# Patient Record
Sex: Male | Born: 1990 | Hispanic: Yes | Marital: Married | Smoking: Never smoker
Health system: Southern US, Community
[De-identification: ages and names within clinical notes are randomized; demographics above are authoritative.]

## PROBLEM LIST (undated history)

## (undated) DIAGNOSIS — U071 COVID-19: Secondary | ICD-10-CM

---
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
## 2017-07-22 DIAGNOSIS — G43909 Migraine, unspecified, not intractable, without status migrainosus: Secondary | ICD-10-CM | POA: Insufficient documentation

## 2019-06-02 ENCOUNTER — Emergency Department (HOSPITAL_COMMUNITY)
Admission: EM | Admit: 2019-06-02 | Discharge: 2019-06-02 | Disposition: A | Discharge status: Home / Self Care | Payer: HRSA Program | Attending: Emergency Medicine | Admitting: Emergency Medicine

## 2019-06-02 ENCOUNTER — Encounter (HOSPITAL_COMMUNITY): Payer: Self-pay | Admitting: Emergency Medicine

## 2019-06-02 ENCOUNTER — Emergency Department (HOSPITAL_COMMUNITY): Payer: HRSA Program

## 2019-06-02 ENCOUNTER — Other Ambulatory Visit: Payer: Self-pay

## 2019-06-02 DIAGNOSIS — R05 Cough: Secondary | ICD-10-CM

## 2019-06-02 DIAGNOSIS — U071 COVID-19: Secondary | ICD-10-CM | POA: Diagnosis not present

## 2019-06-02 DIAGNOSIS — R0602 Shortness of breath: Secondary | ICD-10-CM | POA: Diagnosis present

## 2019-06-02 DIAGNOSIS — R059 Cough, unspecified: Secondary | ICD-10-CM

## 2019-06-02 HISTORY — DX: COVID-19: U07.1

## 2019-06-02 LAB — CBC WITH DIFFERENTIAL/PLATELET
Abs Immature Granulocytes: 0.02 10*3/uL (ref 0.00–0.07)
Basophils Absolute: 0.1 10*3/uL (ref 0.0–0.1)
Basophils Relative: 1 %
Eosinophils Absolute: 0.2 10*3/uL (ref 0.0–0.5)
Eosinophils Relative: 2 %
HCT: 45.1 % (ref 39.0–52.0)
Hemoglobin: 15 g/dL (ref 13.0–17.0)
Immature Granulocytes: 0 %
Lymphocytes Relative: 40 %
Lymphs Abs: 3.3 10*3/uL (ref 0.7–4.0)
MCH: 29.6 pg (ref 26.0–34.0)
MCHC: 33.3 g/dL (ref 30.0–36.0)
MCV: 89.1 fL (ref 80.0–100.0)
Monocytes Absolute: 0.6 10*3/uL (ref 0.1–1.0)
Monocytes Relative: 7 %
Neutro Abs: 4.2 10*3/uL (ref 1.7–7.7)
Neutrophils Relative %: 50 %
Platelets: 497 10*3/uL — ABNORMAL HIGH (ref 150–400)
RBC: 5.06 MIL/uL (ref 4.22–5.81)
RDW: 12.6 % (ref 11.5–15.5)
WBC: 8.3 10*3/uL (ref 4.0–10.5)
nRBC: 0 % (ref 0.0–0.2)

## 2019-06-02 LAB — I-STAT CHEM 8, ED
BUN: 14 mg/dL (ref 6–20)
Calcium, Ion: 1.17 mmol/L (ref 1.15–1.40)
Chloride: 105 mmol/L (ref 98–111)
Creatinine, Ser: 1 mg/dL (ref 0.61–1.24)
Glucose, Bld: 122 mg/dL — ABNORMAL HIGH (ref 70–99)
HCT: 44 % (ref 39.0–52.0)
Hemoglobin: 15 g/dL (ref 13.0–17.0)
Potassium: 3.8 mmol/L (ref 3.5–5.1)
Sodium: 142 mmol/L (ref 135–145)
TCO2: 26 mmol/L (ref 22–32)

## 2019-06-02 LAB — TROPONIN I (HIGH SENSITIVITY): Troponin I (High Sensitivity): 7 ng/L (ref <18)

## 2019-06-02 MED ORDER — IOHEXOL 350 MG/ML SOLN
100.0000 mL | Freq: Once | INTRAVENOUS | Status: AC | PRN
  Administered 2019-06-02: 20:00:00 100 mL via INTRAVENOUS

## 2019-06-02 MED ORDER — BENZONATATE 100 MG PO CAPS: 100.0000 mg | ORAL_CAPSULE | Freq: Three times a day (TID) | ORAL | 0 refills | Status: DC

## 2019-06-02 MED ORDER — ALBUTEROL SULFATE HFA 108 (90 BASE) MCG/ACT IN AERS: 1.0000 | INHALATION_SPRAY | Freq: Four times a day (QID) | RESPIRATORY_TRACT | 0 refills | Status: DC | PRN

## 2019-06-02 MED ORDER — SODIUM CHLORIDE 0.9 % IV BOLUS
1000.0000 mL | Freq: Once | INTRAVENOUS | Status: AC
  Administered 2019-06-02: 1000 mL via INTRAVENOUS

## 2019-06-02 MED ORDER — PREDNISONE 50 MG PO TABS: 50.0000 mg | ORAL_TABLET | Freq: Every day | ORAL | 0 refills | Status: AC

## 2019-06-02 NOTE — ED Triage Notes (Signed)
Sent from urgent care- with cough-- dx with covid 3 weeks ago, had a neg test on 25th, but has had nonstop cough since- no fever--

## 2019-06-02 NOTE — Discharge Instructions (Addendum)
You will need to follow-up outpatient in the post Covid clinic.  I will give him a short course of steroids, albuterol and Tessalon Perles to help with your symptoms.  Return to emergency department for any new worsening symptoms  Your chest of your CT showed they have had Covid pneumonia however denies any active pneumonia at this time.  You have no evidence of blood clot or enlarged heart or fluid on your lungs.  Your labs were unremarkable here in the emergency department

## 2019-06-02 NOTE — ED Provider Notes (Signed)
[facility] EMERGENCY DEPARTMENT Provider Note   CSN: [id number] Arrival date & time: 06/02/19  1752    History Chief Complaint  Patient presents with  . Cough  . covid sx    Julian Carter is a 29 y.o. male.  Prior medical history presents for evaluation of cough and shortness of breath.  Diagnosed with Covid 3 weeks ago.  Had negative test on 3/25.  Has continued to have nonproductive cough.  Gets short of breath with minimal activities.  No chest pain.  No unilateral leg swelling, redness or warmth.  No prior history of PE or DVTs.  No fever, chills, nausea, vomiting, abdominal pain, diarrhea, dysuria.  He has not take anything for symptoms.  No additional aggravating or alleviating factors.  Sent from [facility] urgent care for CTA chest to r/o PE.  History obtained from patient and past medical record.  No interpreter is used.  HPI     Past Medical History:  Diagnosis Date  . COVID-19     There are no problems to display for this patient.   History reviewed. No pertinent surgical history.     No family history on file.  Social History   Tobacco Use  . Smoking status: Never Smoker  . Smokeless tobacco: Never Used  Substance Use Topics  . Alcohol use: Not Currently  . Drug use: Not Currently    Home Medications Prior to Admission medications   Medication Sig Start Date End Date Taking? Authorizing Provider  acetaminophen (TYLENOL) 500 MG tablet Take 1,000 mg by mouth every 6 (six) hours as needed for headache.   Yes [provider]  guaiFENesin-dextromethorphan (ROBITUSSIN DM) 100-10 MG/5ML syrup Take 15 mLs by mouth at bedtime as needed for cough.   Yes [provider]  albuterol (VENTOLIN HFA) 108 (90 Base) MCG/ACT inhaler Inhale 1-2 puffs into the lungs every 6 (six) hours as needed for wheezing or shortness of breath. 06/02/19   Kaven Cumbie A, PA-C  benzonatate (TESSALON) 100 MG capsule Take 1 capsule (100 mg total) by  mouth every 8 (eight) hours. 06/02/19   Shelsy Seng A, PA-C  predniSONE (DELTASONE) 50 MG tablet Take 1 tablet (50 mg total) by mouth daily for 5 days. 06/02/19 06/07/19  Felita Bump A, PA-C    Allergies    Patient has no known allergies.  Review of Systems   Review of Systems  Constitutional: Positive for activity change, appetite change and fatigue.  HENT: Negative.   Respiratory: Positive for cough and shortness of breath. Negative for apnea, choking, chest tightness, wheezing and stridor.   Cardiovascular: Negative.   Gastrointestinal: Negative.   Genitourinary: Negative.   Musculoskeletal: Negative.   Skin: Negative.   Neurological: Negative.   All other systems reviewed and are negative.   Physical Exam Updated Vital Signs BP 119/81 (BP Location: Left Arm)   Pulse 82   Temp 98.7 F (37.1 C) (Oral)   Resp 17   Ht 4\' 11"  (1.499 m)   Wt 59 kg   SpO2 100%   BMI 26.26 kg/m   Physical Exam Vitals and nursing note reviewed.  Constitutional:      General: He is not in acute distress.    Appearance: He is not ill-appearing, toxic-appearing or diaphoretic.  HENT:     Head: Normocephalic and atraumatic.     Jaw: There is normal jaw occlusion.     Right Ear: Tympanic membrane, ear canal and external ear normal. There is no  impacted cerumen. No hemotympanum. Tympanic membrane is not injected, scarred, perforated, erythematous, retracted or bulging.     Left Ear: Tympanic membrane, ear canal and external ear normal. There is no impacted cerumen. No hemotympanum. Tympanic membrane is not injected, scarred, perforated, erythematous, retracted or bulging.     Ears:     Comments: No Mastoid tenderness.    Nose:     Comments: No rhinorrhea and congestion to bilateral nares.  No sinus tenderness.    Mouth/Throat:     Comments: Posterior oropharynx clear.  Mucous membranes moist.  Tonsils without erythema or exudate.  Uvula midline without deviation.  No evidence of PTA or RPA.   No drooling, dysphasia or trismus.  Phonation normal. Neck:     Trachea: Trachea and phonation normal.     Meningeal: Brudzinski's sign and Kernig's sign absent.     Comments: No Neck stiffness or neck rigidity.  No meningismus.  No cervical lymphadenopathy. Cardiovascular:     Rate and Rhythm: Tachycardia present.     Pulses: Normal pulses.          Radial pulses are 2+ on the right side and 2+ on the left side.     Heart sounds: Normal heart sounds.     Comments: No murmurs rubs or gallops. HT 105 in room. Pulmonary:     Effort: Pulmonary effort is normal.     Breath sounds: Normal breath sounds and air entry.     Comments: Clear to auscultation bilaterally without wheeze, rhonchi or rales.  No accessory muscle usage.  Able speak in full sentences. Abdominal:     Comments: Soft, nontender without rebound or guarding.  No CVA tenderness.  Musculoskeletal:     Comments: Moves all 4 extremities without difficulty.  Lower extremities without edema, erythema or warmth.  Skin:    Comments: Brisk capillary refill.  No rashes or lesions.  Neurological:     Mental Status: He is alert.     Comments: Ambulatory in department without difficulty.  Cranial nerves II through XII grossly intact.  No facial droop.  No aphasia.     ED Results / Procedures / Treatments   Labs (all labs ordered are listed, but only abnormal results are displayed) Labs Reviewed  CBC WITH DIFFERENTIAL/PLATELET - Abnormal; Notable for the following components:      Result Value   Platelets 497 (*)    All other components within normal limits  I-STAT CHEM 8, ED - Abnormal; Notable for the following components:   Glucose, Bld 122 (*)    All other components within normal limits  TROPONIN I (HIGH SENSITIVITY)    EKG EKG Interpretation  Date/Time:  Friday June 02 2019 19:11:12 EDT Ventricular Rate:  75 PR Interval:  130 QRS Duration: 70 QT Interval:  300 QTC Calculation: 335 R Axis:   -88 Text  Interpretation: Normal sinus rhythm Left axis deviation Cannot rule out Anterior infarct , age undetermined ST & T wave abnormality, consider lateral ischemia Abnormal ECG When compared to prior, less artifact. No STEMI Confirmed by Theda Belfast ([id number]) on 06/02/2019 7:41:21 PM   Radiology CT Angio Chest PE W and/or Wo Contrast  Result Date: 06/02/2019 CLINICAL DATA:  Shortness of breath, history of COVID-19 positivity EXAM: CT ANGIOGRAPHY CHEST WITH CONTRAST TECHNIQUE: Multidetector CT imaging of the chest was performed using the standard protocol during bolus administration of intravenous contrast. Multiplanar CT image reconstructions and MIPs were obtained to evaluate the vascular anatomy. CONTRAST:  OMNIPAQUE IOHEXOL  350 MG/ML SOLN COMPARISON:  None. FINDINGS: Cardiovascular: Thoracic aorta shows no evidence of aneurysmal dilatation or dissection. No cardiac enlargement is seen. The pulmonary artery shows a normal branching pattern. No filling defect is identified to suggest pulmonary embolism. Mediastinum/Nodes: Thoracic inlet is within normal limits. No hilar or mediastinal adenopathy is noted. The esophagus as visualized is within normal limits. Lungs/Pleura: The lungs are well aerated bilaterally. Some minimal ground-glass opacities are noted likely related to residual from the prior history of COVID-19 positivity. No focal infiltrate or sizable effusion is seen. No parenchymal nodules are noted. Upper Abdomen: Visualized upper abdomen shows no acute abnormality. Fatty infiltration of the liver is noted. Musculoskeletal: No chest wall abnormality. No acute or significant osseous findings. Review of the MIP images confirms the above findings. IMPRESSION: No evidence of pulmonary emboli. Minimal residual ground-glass opacities from prior COVID-19 positivity. No acute abnormality is noted. Electronically Signed   By: Alcide Clever M.D.   On: 06/02/2019 20:05    Procedures Procedures (including  critical care time)  Medications Ordered in ED Medications  sodium chloride 0.9 % bolus 1,000 mL (0 mLs Intravenous Stopped 06/02/19 1955)  iohexol (OMNIPAQUE) 350 MG/ML injection 100 mL (100 mLs Intravenous Contrast Given 06/02/19 1953)   ED Course  I have reviewed the triage vital signs and the nursing notes.  Pertinent labs & imaging results that were available during my care of the patient were reviewed by me and considered in my medical decision making (see chart for details).  29 year old male appears otherwise well presents for evaluation of cough and shortness of breath.  He is afebrile, nonseptic, non-ill-appearing.  Remote history of Covid diagnosis 3 weeks ago.  Has having some dyspnea on exertion heart is not chest pain.  No evidence of DVT on exam.  Was seen by urgent care today noted to be tachycardic.  Had chest x-ray which I personally reviewed which did not show any evidence of cardiomegaly, infiltrates, pneumothorax or pulmonary edema.  He was sent to the emergency department for CT scan of chest to rule out PE.  Labs and imaging personally reviewed and interpreted: CBC without leukocytosis Chem-8 with mild hyperglycemia were noted electrolyte, renal abnormality CTA negative for PE, does have groundglass opacities consistent with prior Covid infection EKG without STEMI  Troponin 7, low suspicion for ACS given symptoms times weeks.  Patient reassessed.  Question post Covid syndrome. Tachycardia improved with IVF. Tolerating PO intake without difficulty.  Will DC home with short course of steroids given continued cough as well as some albuterol.  He will be given resources to follow-up outpatient in the post Covid clinic.  Ambulatory without any hypoxia.  Patient is to be discharged with recommendation to follow up with PCP in regards to today's hospital visit. VSS, no tracheal deviation, no JVD or new murmur, RRR, breath sounds equal bilaterally, EKG without acute abnormalities,  negative troponin, and negative CTA chest. Pt has been advised to return to the ED if CP becomes exertional, associated with diaphoresis or nausea, radiates to left jaw/arm, worsens or becomes concerning in any way. Pt appears reliable for follow up and is agreeable to discharge.   The patient has been appropriately medically screened and/or stabilized in the ED. I have low suspicion for any other emergent medical condition which would require further screening, evaluation or treatment in the ED or require inpatient management.  Patient is hemodynamically stable and in no acute distress.  Patient able to ambulate in department prior to ED.  Evaluation does not show acute pathology that would require ongoing or additional emergent interventions while in the emergency department or further inpatient treatment.  I have discussed the diagnosis with the patient and answered all questions.  Pain is been managed while in the emergency department and patient has no further complaints prior to discharge.  Patient is comfortable with plan discussed in room and is stable for discharge at this time.  I have discussed strict return precautions for returning to the emergency department.  Patient was encouraged to follow-up with PCP/specialist refer to at discharge.    MDM Rules/Calculators/A&P                      Julian Carter was evaluated in Emergency Department on 06/02/2019 for the symptoms described in the history of present illness. He was evaluated in the context of the global COVID-19 pandemic, which necessitated consideration that the patient might be at risk for infection with the SARS-CoV-2 virus that causes COVID-19. Institutional protocols and algorithms that pertain to the evaluation of patients at risk for COVID-19 are in a state of rapid change based on information released by regulatory bodies including the CDC and federal and state organizations. These policies and algorithms were followed during the  patient's care in the ED. Final Clinical Impression(s) / ED Diagnoses Final diagnoses:  COVID-19  Cough  SOB (shortness of breath)    Rx / DC Orders ED Discharge Orders         Ordered    predniSONE (DELTASONE) 50 MG tablet  Daily     06/02/19 2037    albuterol (VENTOLIN HFA) 108 (90 Base) MCG/ACT inhaler  Every 6 hours PRN     06/02/19 2037    benzonatate (TESSALON) 100 MG capsule  Every 8 hours     06/02/19 2037           Cashlyn Huguley A, PA-C 06/02/19 2111    Tegeler, Canary Brim, MD 06/02/19 2312

## 2019-06-14 ENCOUNTER — Emergency Department (HOSPITAL_COMMUNITY): Payer: Self-pay

## 2019-06-14 ENCOUNTER — Emergency Department (HOSPITAL_COMMUNITY)
Admission: EM | Admit: 2019-06-14 | Discharge: 2019-06-15 | Disposition: A | Discharge status: Home / Self Care | Payer: Self-pay | Attending: Emergency Medicine | Admitting: Emergency Medicine

## 2019-06-14 ENCOUNTER — Other Ambulatory Visit: Payer: Self-pay

## 2019-06-14 ENCOUNTER — Encounter (HOSPITAL_COMMUNITY): Payer: Self-pay | Admitting: Emergency Medicine

## 2019-06-14 DIAGNOSIS — J069 Acute upper respiratory infection, unspecified: Secondary | ICD-10-CM | POA: Insufficient documentation

## 2019-06-14 DIAGNOSIS — J029 Acute pharyngitis, unspecified: Secondary | ICD-10-CM | POA: Insufficient documentation

## 2019-06-14 DIAGNOSIS — R0602 Shortness of breath: Secondary | ICD-10-CM | POA: Insufficient documentation

## 2019-06-14 LAB — URINALYSIS, ROUTINE W REFLEX MICROSCOPIC
Bilirubin Urine: NEGATIVE
Glucose, UA: NEGATIVE mg/dL
Hgb urine dipstick: NEGATIVE
Ketones, ur: 5 mg/dL — AB
Leukocytes,Ua: NEGATIVE
Nitrite: NEGATIVE
Protein, ur: NEGATIVE mg/dL
Specific Gravity, Urine: 1.012 (ref 1.005–1.030)
pH: 5 (ref 5.0–8.0)

## 2019-06-14 LAB — CBC WITH DIFFERENTIAL/PLATELET
Abs Immature Granulocytes: 0.08 10*3/uL — ABNORMAL HIGH (ref 0.00–0.07)
Basophils Absolute: 0 10*3/uL (ref 0.0–0.1)
Basophils Relative: 0 %
Eosinophils Absolute: 0 10*3/uL (ref 0.0–0.5)
Eosinophils Relative: 0 %
HCT: 42.5 % (ref 39.0–52.0)
Hemoglobin: 14.8 g/dL (ref 13.0–17.0)
Immature Granulocytes: 1 %
Lymphocytes Relative: 10 %
Lymphs Abs: 1.5 10*3/uL (ref 0.7–4.0)
MCH: 30.5 pg (ref 26.0–34.0)
MCHC: 34.8 g/dL (ref 30.0–36.0)
MCV: 87.4 fL (ref 80.0–100.0)
Monocytes Absolute: 0.6 10*3/uL (ref 0.1–1.0)
Monocytes Relative: 4 %
Neutro Abs: 12 10*3/uL — ABNORMAL HIGH (ref 1.7–7.7)
Neutrophils Relative %: 85 %
Platelets: 246 10*3/uL (ref 150–400)
RBC: 4.86 MIL/uL (ref 4.22–5.81)
RDW: 12.9 % (ref 11.5–15.5)
WBC: 14.2 10*3/uL — ABNORMAL HIGH (ref 4.0–10.5)
nRBC: 0 % (ref 0.0–0.2)

## 2019-06-14 LAB — COMPREHENSIVE METABOLIC PANEL
ALT: 85 U/L — ABNORMAL HIGH (ref 0–44)
AST: 43 U/L — ABNORMAL HIGH (ref 15–41)
Albumin: 3.9 g/dL (ref 3.5–5.0)
Alkaline Phosphatase: 94 U/L (ref 38–126)
Anion gap: 11 (ref 5–15)
BUN: 8 mg/dL (ref 6–20)
CO2: 22 mmol/L (ref 22–32)
Calcium: 8.7 mg/dL — ABNORMAL LOW (ref 8.9–10.3)
Chloride: 98 mmol/L (ref 98–111)
Creatinine, Ser: 0.83 mg/dL (ref 0.61–1.24)
GFR calc Af Amer: >60 mL/min (ref >60)
GFR calc non Af Amer: >60 mL/min (ref >60)
Glucose, Bld: 110 mg/dL — ABNORMAL HIGH (ref 70–99)
Potassium: 3.3 mmol/L — ABNORMAL LOW (ref 3.5–5.1)
Sodium: 131 mmol/L — ABNORMAL LOW (ref 135–145)
Total Bilirubin: 1 mg/dL (ref 0.3–1.2)
Total Protein: 7.2 g/dL (ref 6.5–8.1)

## 2019-06-14 LAB — LACTIC ACID, PLASMA: Lactic Acid, Venous: 1.1 mmol/L (ref 0.5–1.9)

## 2019-06-14 LAB — GROUP A STREP BY PCR: Group A Strep by PCR: NOT DETECTED

## 2019-06-14 MED ORDER — SODIUM CHLORIDE 0.9% FLUSH: 3.0000 mL | Freq: Once | INTRAVENOUS | Status: DC

## 2019-06-14 MED ORDER — ACETAMINOPHEN 325 MG PO TABS
650.0000 mg | ORAL_TABLET | Freq: Once | ORAL | Status: AC | PRN
  Administered 2019-06-14: 16:00:00 650 mg via ORAL
  Filled 2019-06-14: qty 2

## 2019-06-14 MED ORDER — IBUPROFEN 400 MG PO TABS
600.0000 mg | ORAL_TABLET | Freq: Once | ORAL | Status: AC
  Administered 2019-06-14: 600 mg via ORAL
  Filled 2019-06-14: qty 1

## 2019-06-14 MED ORDER — SODIUM CHLORIDE 0.9 % IV BOLUS
1000.0000 mL | Freq: Once | INTRAVENOUS | Status: AC
  Administered 2019-06-14: 1000 mL via INTRAVENOUS

## 2019-06-14 MED ORDER — SODIUM CHLORIDE 0.9 % IV BOLUS
1000.0000 mL | Freq: Once | INTRAVENOUS | Status: AC
  Administered 2019-06-14: 21:00:00 1000 mL via INTRAVENOUS

## 2019-06-14 MED ORDER — GUAIFENESIN-DM 100-10 MG/5ML PO SYRP: 5.0000 mL | ORAL_SOLUTION | Freq: Three times a day (TID) | ORAL | 0 refills | Status: DC | PRN

## 2019-06-14 NOTE — ED Triage Notes (Signed)
Pt c/o shortness of breath, cough and fever. States he tested +covid x 5 weeks ago and has had a negative test since. Febrile in triage.

## 2019-06-14 NOTE — Discharge Instructions (Signed)
Try keep yourself hydrated.  Take Motrin Tylenol as needed for fevers.  Return for worsening shortness of breath or worsening fatigue.

## 2019-06-14 NOTE — ED Provider Notes (Signed)
[facility] EMERGENCY DEPARTMENT Provider Note   CSN: [id number] Arrival date & time: 06/14/19  1543     History Chief Complaint  Patient presents with  . Cough    Julian Carter is a 29 y.o. male.  HPI Patient Modena Jansky with fever cough shortness of breath.  Around 5 weeks ago was positive for COVID-19.  Since the positive test he has also had a negative test.  He was seen in the ER 12 days ago.  Sent in from urgent care with shortness of breath.  Had CT scan which showed lung disease but no pulmonary embolism.  Had been doing well this had some chronic shortness of breath, however over the last few days now developed fever congestion and mild sore throat.  States he gets frequent strep infections.  States he has 2 daughters and one of them recently was warm and another had sore throat.  No real change in his cough which has had difficulty controlling.  No nausea or vomiting now but states he had some while he had Covid.  States he was hit fairly hard by the Covid infection.  Does not smoke.  No dysuria.  No diarrhea.  No abdominal pain.    Past Medical History:  Diagnosis Date  . COVID-19     There are no problems to display for this patient.   History reviewed. No pertinent surgical history.     No family history on file.  Social History   Tobacco Use  . Smoking status: Never Smoker  . Smokeless tobacco: Never Used  Substance Use Topics  . Alcohol use: Not Currently  . Drug use: Not Currently    Home Medications Prior to Admission medications   Medication Sig Start Date End Date Taking? Authorizing Provider  acetaminophen (TYLENOL) 500 MG tablet Take 1,000 mg by mouth every 6 (six) hours as needed for headache.   Yes [provider]  albuterol (VENTOLIN HFA) 108 (90 Base) MCG/ACT inhaler Inhale 1-2 puffs into the lungs every 6 (six) hours as needed for wheezing or shortness of breath. 06/02/19  Yes Henderly, Britni A, PA-C  benzonatate  (TESSALON) 100 MG capsule Take 1 capsule (100 mg total) by mouth every 8 (eight) hours. 06/02/19  Yes Henderly, Britni A, PA-C  guaiFENesin-dextromethorphan (ROBITUSSIN DM) 100-10 MG/5ML syrup Take 5 mLs by mouth 3 (three) times daily as needed for cough. 06/14/19   Benjiman Core, MD    Allergies    Patient has no known allergies.  Review of Systems   Review of Systems  Constitutional: Positive for appetite change.  HENT: Positive for congestion and sore throat.   Respiratory: Positive for cough and shortness of breath.   Cardiovascular: Negative for chest pain.  Gastrointestinal: Negative for abdominal pain.  Genitourinary: Negative for flank pain.  Musculoskeletal: Negative for back pain.  Skin: Negative for rash.  Neurological: Negative for weakness.  Psychiatric/Behavioral: Negative for confusion.    Physical Exam Updated Vital Signs BP 129/83   Pulse (!) 125   Temp (!) 102.3 F (39.1 C) (Oral)   Resp (!) 26   Ht 4\' 11"  (1.499 m)   Wt 59 kg   SpO2 98%   BMI 26.26 kg/m   Physical Exam Vitals and nursing note reviewed.  HENT:     Mouth/Throat:     Pharynx: Posterior oropharyngeal erythema present. No oropharyngeal exudate.     Comments: Bilateral tonsils swollen.  No exudate.  Uvula midline. Cardiovascular:  Rate and Rhythm: Tachycardia present.  Pulmonary:     Breath sounds: No wheezing, rhonchi or rales.  Abdominal:     Tenderness: There is no abdominal tenderness.  Musculoskeletal:     Right lower leg: No edema.     Left lower leg: No edema.  Skin:    Capillary Refill: Capillary refill takes less than 2 seconds.  Neurological:     Mental Status: He is alert and oriented to person, place, and time.     ED Results / Procedures / Treatments   Labs (all labs ordered are listed, but only abnormal results are displayed) Labs Reviewed  COMPREHENSIVE METABOLIC PANEL - Abnormal; Notable for the following components:      Result Value   Sodium 131 (*)     Potassium 3.3 (*)    Glucose, Bld 110 (*)    Calcium 8.7 (*)    AST 43 (*)    ALT 85 (*)    All other components within normal limits  CBC WITH DIFFERENTIAL/PLATELET - Abnormal; Notable for the following components:   WBC 14.2 (*)    Neutro Abs 12.0 (*)    Abs Immature Granulocytes 0.08 (*)    All other components within normal limits  GROUP A STREP BY PCR  LACTIC ACID, PLASMA  LACTIC ACID, PLASMA  URINALYSIS, ROUTINE W REFLEX MICROSCOPIC    EKG None  Radiology DG Chest Portable 1 View  Result Date: 06/14/2019 CLINICAL DATA:  Shortness of breath and fever EXAM: PORTABLE CHEST 1 VIEW COMPARISON:  CT 06/02/2019 FINDINGS: The heart size and mediastinal contours are within normal limits. Both lungs are clear. The visualized skeletal structures are unremarkable. IMPRESSION: No active disease. Electronically Signed   By: Jasmine Pang M.D.   On: 06/14/2019 20:43    Procedures Procedures (including critical care time)  Medications Ordered in ED Medications  sodium chloride flush (NS) 0.9 % injection 3 mL (3 mLs Intravenous Not Given 06/14/19 2017)  acetaminophen (TYLENOL) tablet 650 mg (650 mg Oral Given 06/14/19 1608)  sodium chloride 0.9 % bolus 1,000 mL (0 mLs Intravenous Stopped 06/14/19 2231)  sodium chloride 0.9 % bolus 1,000 mL (0 mLs Intravenous Stopped 06/14/19 2326)  ibuprofen (ADVIL) tablet 600 mg (600 mg Oral Given 06/14/19 2231)    ED Course  I have reviewed the triage vital signs and the nursing notes.  Pertinent labs & imaging results that were available during my care of the patient were reviewed by me and considered in my medical decision making (see chart for details).    MDM Rules/Calculators/A&P                      Patient presents with shortness of breath and cough.  Had Covid around 5 weeks ago.  2 weeks ago had a CT scan of showed lung changes from the Covid but no pneumonia.  The last couple days has had fevers.  Still has cough but really unchanged.  Blood  pressure maintained.  Normal lactic acid.  X-ray reassuring.  However does have tachycardia.  Has remained tachycardic.  It is a sinus tachycardia.  Not hypoxic with ambulation.  Discussed with patient and will give a trial at home.  Patient's family members at home had URI symptoms.  Do not think he needs any Covid test since he was positive 5 weeks ago.  Doubt pulmonary embolism at this time.  Will discharge home.  LFTs are mildly elevated which will need to be followed. Final  Clinical Impression(s) / ED Diagnoses Final diagnoses:  Upper respiratory tract infection, unspecified type    Rx / DC Orders ED Discharge Orders         Ordered    guaiFENesin-dextromethorphan (ROBITUSSIN DM) 100-10 MG/5ML syrup  3 times daily PRN     06/14/19 2331           Davonna Belling, MD 06/14/19 2333

## 2019-06-15 NOTE — ED Notes (Signed)
Discharge instructions reviewed with pt. Pt verbalized understanding.   

## 2019-07-03 ENCOUNTER — Telehealth: Payer: Self-pay | Admitting: Pulmonary Disease

## 2019-07-03 NOTE — Telephone Encounter (Signed)
Spoke with pt further, he has no other side effects from covid aside from intermittent back muscle aches.  Advised pt that we can keep appt tomorrow as scheduled.  Nothing further needed at this time- will close encounter.

## 2019-07-03 NOTE — Telephone Encounter (Signed)
Patient is returning phone call. Patient phone number is [phone].

## 2019-07-03 NOTE — Telephone Encounter (Signed)
ATC patient to discuss in further detail - no answer and VM box is full.  WCB.

## 2019-07-04 ENCOUNTER — Encounter: Payer: Self-pay | Admitting: Pulmonary Disease

## 2019-07-04 ENCOUNTER — Other Ambulatory Visit: Payer: Self-pay

## 2019-07-04 ENCOUNTER — Ambulatory Visit (INDEPENDENT_AMBULATORY_CARE_PROVIDER_SITE_OTHER): Payer: Self-pay | Admitting: Pulmonary Disease

## 2019-07-04 DIAGNOSIS — R0602 Shortness of breath: Secondary | ICD-10-CM

## 2019-07-04 MED ORDER — BUDESONIDE-FORMOTEROL FUMARATE 160-4.5 MCG/ACT IN AERO: 2.0000 | INHALATION_SPRAY | Freq: Two times a day (BID) | RESPIRATORY_TRACT | 6 refills | Status: DC

## 2019-07-04 NOTE — Progress Notes (Signed)
Julian Carter    [id number]    1991-02-25  Primary Care Physician:Patient, No Pcp Per  Referring Physician: No referring provider defined for this encounter.  Chief complaint:   Ground glass opacities on CT after Covid-19 pneumonia   HPI: Julian Carter is a 29 year old male with out a significant past medical history before testing positive for COVID-19 on 3/15.  Patient self quarantine for approximately 2 weeks and was improving with overall symptoms continue to have shortness of breath and cough.  He went to urgent care on 4/2 sent [facility] to [facility].  In the emergency department he was noted to have tachycardia and ground glass opacities on CT after post Covid-19 Pneumonia .  Patient was discharged from the emergency department and presented again on 4/14 with cough, shortness of breath and tachycardia.  ED provider noted patient was in sinus tachycardia with normal BP, and had a reassuring x-ray.  Patient was febril at the time to 102.3.   Patients SOB and cough are improving. He continues to have a cough 2-3 times a day. In the last week patient has had 3-4 episodes per day of sharp right sided back pain and noticed he starting coughing up blood.    Pets: No pets Occupation: Works as a Barrister's clerk  Exposures: No know exposure, no hot tub or mold.  Smoking history: Not a current smoker. Smokes a cigarette or two a month with co-workers.  Travel history: No recent travel history  Relevant family history: no family history of lung disease    Outpatient Encounter Medications as of 07/04/2019  Medication Sig  . acetaminophen (TYLENOL) 500 MG tablet Take 1,000 mg by mouth every 6 (six) hours as needed for headache.  . albuterol (VENTOLIN HFA) 108 (90 Base) MCG/ACT inhaler Inhale 1-2 puffs into the lungs every 6 (six) hours as needed for wheezing or shortness of breath.  . benzonatate (TESSALON) 100 MG capsule Take 1 capsule (100 mg total) by mouth every 8 (eight) hours.    Marland Kitchen guaiFENesin-dextromethorphan (ROBITUSSIN DM) 100-10 MG/5ML syrup Take 5 mLs by mouth 3 (three) times daily as needed for cough.   No facility-administered encounter medications on file as of 07/04/2019.    Allergies as of 07/04/2019  . (No Known Allergies)    Past Medical History:  Diagnosis Date  . COVID-19     History reviewed. No pertinent surgical history.  History reviewed. No pertinent family history.  Social History   Socioeconomic History  . Marital status: Married    Spouse name: Not on file  . Number of children: Not on file  . Years of education: Not on file  . Highest education level: Not on file  Occupational History  . Not on file  Tobacco Use  . Smoking status: Never Smoker  . Smokeless tobacco: Never Used  Substance and Sexual Activity  . Alcohol use: Not Currently    Comment: occ.   . Drug use: Not Currently  . Sexual activity: Not on file  Other Topics Concern  . Not on file  Social History Narrative  . Not on file   Social Determinants of Health   Financial Resource Strain:   . Difficulty of Paying Living Expenses:   Food Insecurity:   . Worried About Charity fundraiser in the Last Year:   . Arboriculturist in the Last Year:   Transportation Needs:   . Film/video editor (Medical):   Marland Kitchen  Lack of Transportation (Non-Medical):   Physical Activity:   . Days of Exercise per Week:   . Minutes of Exercise per Session:   Stress:   . Feeling of Stress :   Social Connections:   . Frequency of Communication with Friends and Family:   . Frequency of Social Gatherings with Friends and Family:   . Attends Religious Services:   . Active Member of Clubs or Organizations:   . Attends Banker Meetings:   Marland Kitchen Marital Status:   Intimate Partner Violence:   . Fear of Current or Ex-Partner:   . Emotionally Abused:   Marland Kitchen Physically Abused:   . Sexually Abused:     Review of systems: Review of Systems  Constitutional: Negative for  fever and chills.  HENT: Negative.   Eyes: Negative for blurred vision.  Respiratory: as per HPI  Cardiovascular: Negative for chest pain and palpitations.  Gastrointestinal: Negative for vomiting, diarrhea, blood per rectum. Genitourinary: Negative for dysuria, urgency, frequency and hematuria.  Musculoskeletal: Negative for myalgias, back pain and joint pain.  Skin: Negative for itching and rash.  Neurological: Negative for dizziness, tremors, focal weakness, seizures and loss of consciousness.  Endo/Heme/Allergies: Negative for environmental allergies.  Psychiatric/Behavioral: Negative for depression, suicidal ideas and hallucinations.  All other systems reviewed and are negative.  Physical Exam: Blood pressure 108/64, pulse 80, temperature 97.7 F (36.5 C), temperature source Temporal, height 4\' 11"  (1.499 m), weight 124 lb 12.8 oz (56.6 kg), SpO2 97 %. Gen:      No acute distress HEENT:  EOMI, sclera anicteric Neck:     No masses; no thyromegaly Lungs:    Clear to auscultation bilaterally; normal respiratory effor CV:         Regular rate and rhythm; no murmurs Abd:      + bowel sounds; soft, non-tender; no palpable masses, no distension Ext:    No edema; adequate peripheral perfusion Skin:      Warm and dry; no rash Neuro: alert and oriented x 3 Psych: normal mood and affect  Data Reviewed: Imaging: CT PE 06/02/2019: Ground glass opacities noted , no PE I have reviewed the images personally.  PFTs:  Labs:  CBC 4/14: WBC 14, neutrophil predominant    Assessment:  Patient presents approximately 7 weeks post Covid-19 infection. He was never hospitalized but presented multiple time to ED for SOB, cough, and tachycardia. CT PE on 4/02 did not show PE, but shows some ground glass opacities and patient symptoms suggest ongoing inflammatory process. Will prescribe Symbicort for inflammation.   - PFTs to better evaluate lung function  - High resolution CT to characterize  changes in lung parenchyma s/p Covid-19 pneumonia   Plan/Recommendations: - Symbicort - PFT's - High Resolution CT   6/02, MD PGY1   Attending note: I have seen and examined the patient. History, labs and imaging reviewed. Patient is 7 weeks post Covid.  Continues to have persistent symptoms, intermittent tachycardia with prior CT scan showing some groundglass opacities Checking high-res CT, PFTs for better evaluation Trial Symbicort.  Thurmon Fair MD [facility] 07/05/2019, 3:12 PM  CC: No ref. provider found

## 2019-07-04 NOTE — Patient Instructions (Signed)
Thank you for trusting [place] with your care. We will prescribe Symbicort to help with your lung inflammation. In addition we will better evaluate your lungs with pulmonary function test and CT Scan of your chest.   Symbicort PFT's High Resolution CT Scan Chest

## 2019-07-18 ENCOUNTER — Other Ambulatory Visit: Payer: Self-pay

## 2019-07-18 ENCOUNTER — Ambulatory Visit (INDEPENDENT_AMBULATORY_CARE_PROVIDER_SITE_OTHER)
Admission: RE | Admit: 2019-07-18 | Discharge: 2019-07-18 | Disposition: A | Discharge status: Home / Self Care | Payer: Self-pay | Source: Ambulatory Visit | Attending: Pulmonary Disease | Admitting: Pulmonary Disease

## 2019-07-18 DIAGNOSIS — R0602 Shortness of breath: Secondary | ICD-10-CM

## 2019-08-14 ENCOUNTER — Other Ambulatory Visit (HOSPITAL_COMMUNITY)
Admission: RE | Admit: 2019-08-14 | Discharge: 2019-08-14 | Disposition: A | Discharge status: Home / Self Care | Payer: HRSA Program | Source: Ambulatory Visit | Attending: Pulmonary Disease | Admitting: Pulmonary Disease

## 2019-08-14 DIAGNOSIS — Z20822 Contact with and (suspected) exposure to covid-19: Secondary | ICD-10-CM | POA: Insufficient documentation

## 2019-08-14 DIAGNOSIS — Z01812 Encounter for preprocedural laboratory examination: Secondary | ICD-10-CM | POA: Insufficient documentation

## 2019-08-14 LAB — SARS CORONAVIRUS 2 (TAT 6-24 HRS): SARS Coronavirus 2: NEGATIVE

## 2019-08-17 ENCOUNTER — Ambulatory Visit (INDEPENDENT_AMBULATORY_CARE_PROVIDER_SITE_OTHER): Payer: Self-pay | Admitting: Pulmonary Disease

## 2019-08-17 ENCOUNTER — Other Ambulatory Visit: Payer: Self-pay

## 2019-08-17 DIAGNOSIS — R0602 Shortness of breath: Secondary | ICD-10-CM

## 2019-08-17 LAB — PULMONARY FUNCTION TEST
DL/VA % pred: 93 %
DL/VA: 4.93 ml/min/mmHg/L
DLCO cor % pred: 96 %
DLCO cor: 22.74 ml/min/mmHg
DLCO unc % pred: 96 %
DLCO unc: 22.74 ml/min/mmHg
FEF 25-75 Post: 4.34 L/sec
FEF 25-75 Pre: 4.25 L/sec
FEF2575-%Change-Post: 2 %
FEF2575-%Pred-Post: 115 %
FEF2575-%Pred-Pre: 113 %
FEV1-%Change-Post: 0 %
FEV1-%Pred-Post: 98 %
FEV1-%Pred-Pre: 98 %
FEV1-Post: 3.31 L
FEV1-Pre: 3.31 L
FEV1FVC-%Change-Post: 0 %
FEV1FVC-%Pred-Pre: 106 %
FEV6-%Change-Post: 0 %
FEV6-%Pred-Post: 94 %
FEV6-%Pred-Pre: 95 %
FEV6-Post: 3.74 L
FEV6-Pre: 3.77 L
FEV6FVC-%Pred-Post: 100 %
FEV6FVC-%Pred-Pre: 100 %
FVC-%Change-Post: 0 %
FVC-%Pred-Post: 94 %
FVC-%Pred-Pre: 95 %
FVC-Post: 3.76 L
FVC-Pre: 3.77 L
Post FEV1/FVC ratio: 88 %
Post FEV6/FVC ratio: 100 %
Pre FEV1/FVC ratio: 88 %
Pre FEV6/FVC Ratio: 100 %
RV % pred: 135 %
RV: 1.37 L
TLC % pred: 105 %
TLC: 5.16 L

## 2019-08-17 NOTE — Progress Notes (Signed)
PFT done today. 

## 2019-08-18 NOTE — Telephone Encounter (Signed)
Dr. Mannam please advise on CT results. 

## 2019-08-18 NOTE — Telephone Encounter (Signed)
I think he is referring to PFTs done yesterday. I have already send a result note on CT that was seen on mychart by patient  Please let him know PFTs shows normal lung function.   Chilton Greathouse MD [facility] 08/18/2019, 4:15 PM

## 2021-04-24 IMAGING — CT CT ANGIO CHEST
2 of 6 series · 18 of 46 positions shown · IV contrast (omnipaque)
Comparison: None.

CLINICAL DATA: Shortness of breath, history of NGX69-OW positivity

EXAM:
CT ANGIOGRAPHY CHEST WITH CONTRAST
TECHNIQUE: Multidetector CT imaging of the chest was performed using the
standard protocol during bolus administration of intravenous
contrast. Multiplanar CT image reconstructions and MIPs were
obtained to evaluate the vascular anatomy.
CONTRAST:  100mL OMNIPAQUE IOHEXOL 350 MG/ML SOLN

[Series 6: thins · axial · 0.71mm/px · z∈[+959,+1189]mm · 15 of 252 slices shown]
[im 11/252  lung]
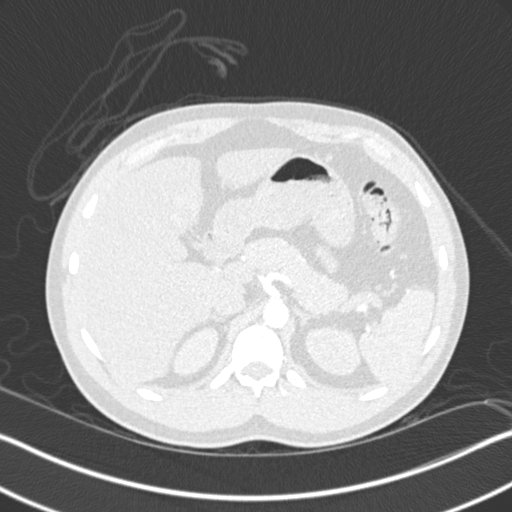
[im 33/252  soft-tissue]
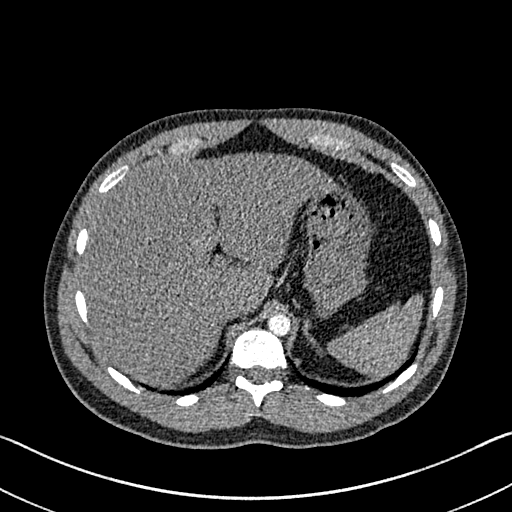
[im 44/252  lung]
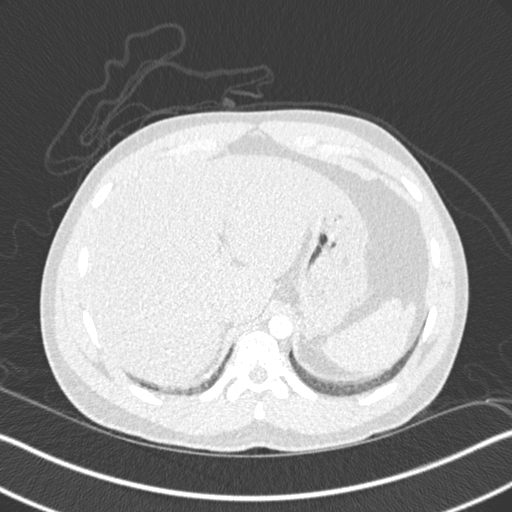
[im 66/252  soft-tissue]
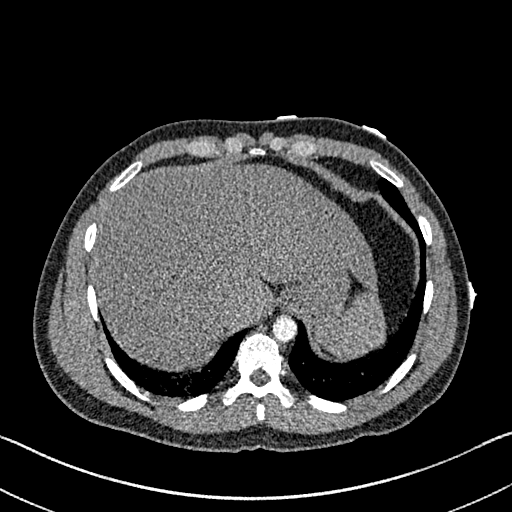
[im 77/252  lung]
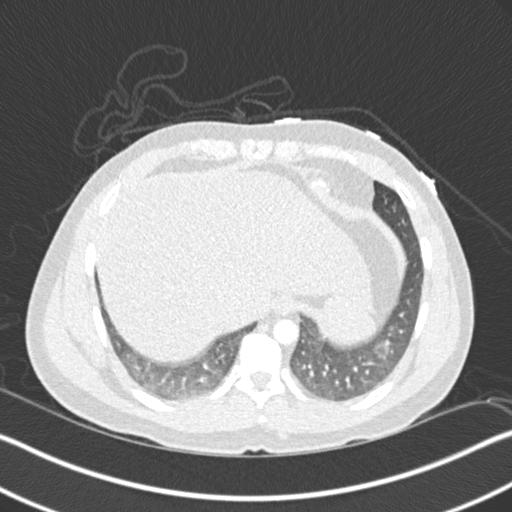
[im 99/252  soft-tissue]
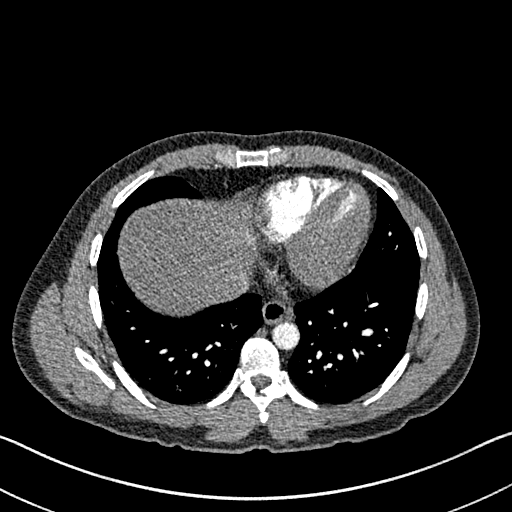
[im 110/252  lung]
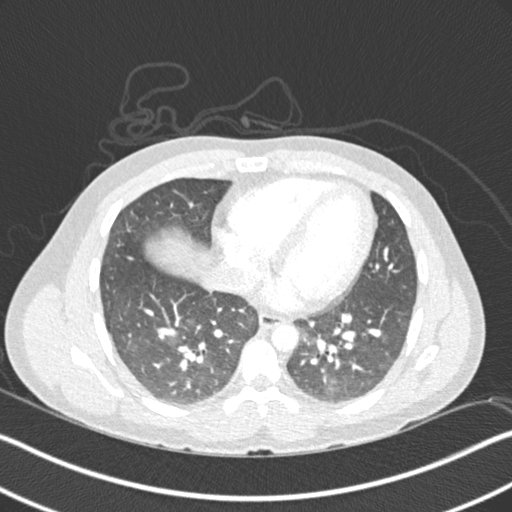
[im 131/252  soft-tissue]
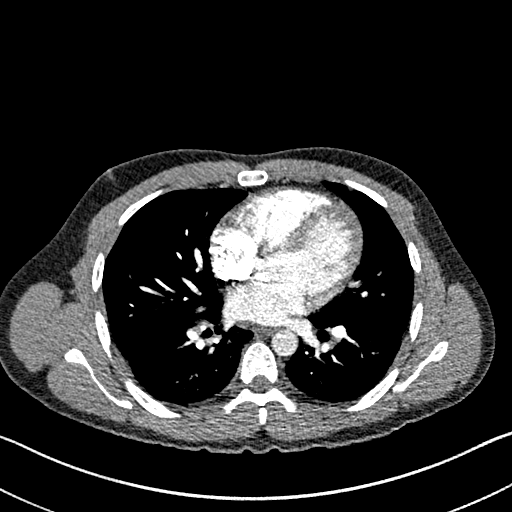
[im 142/252  lung]
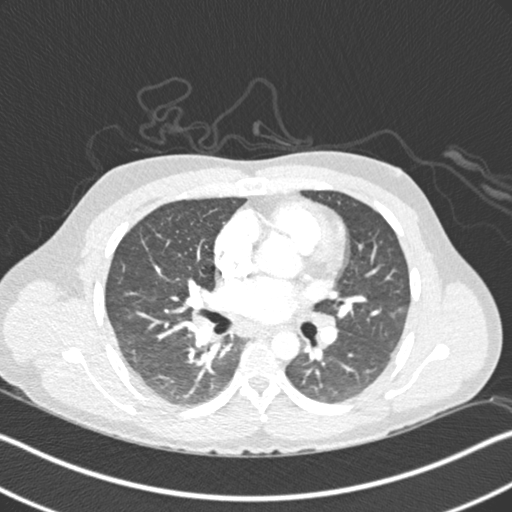
[im 153/252  soft-tissue]
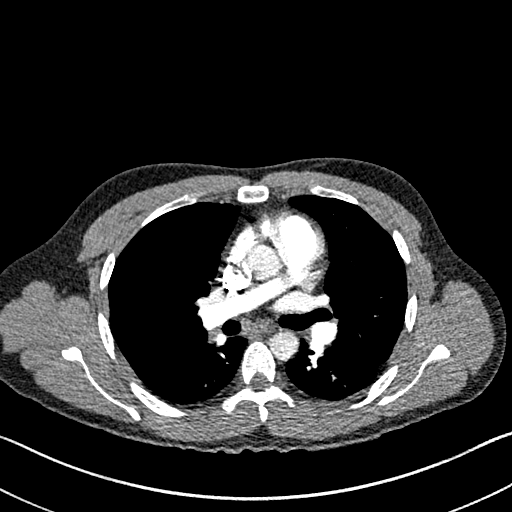
[im 175/252  lung]
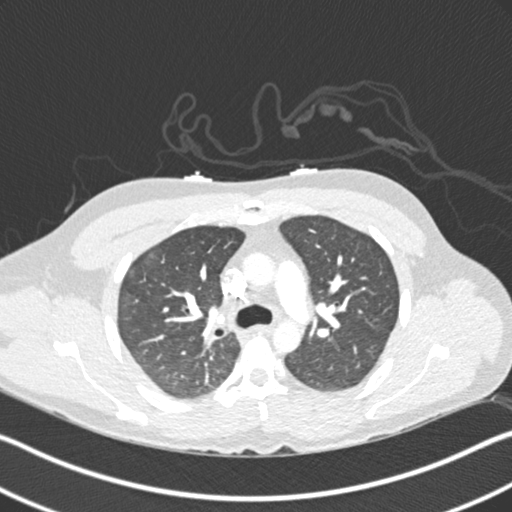
[im 186/252  soft-tissue]
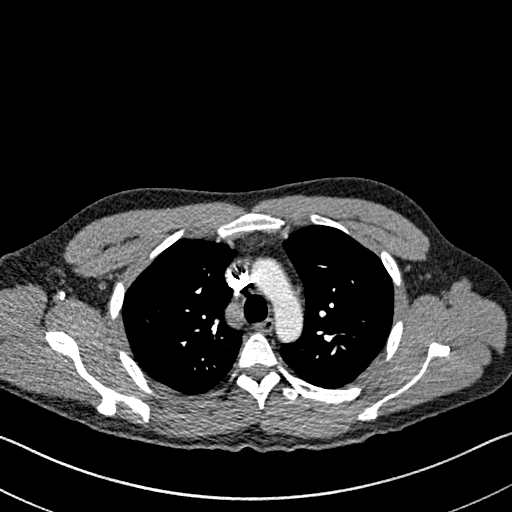
[im 208/252  lung]
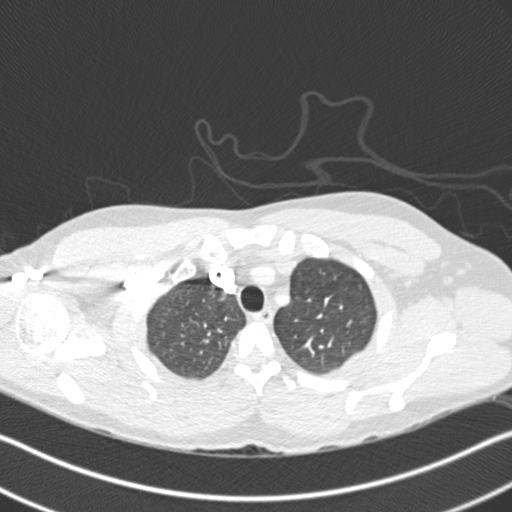
[im 219/252  soft-tissue]
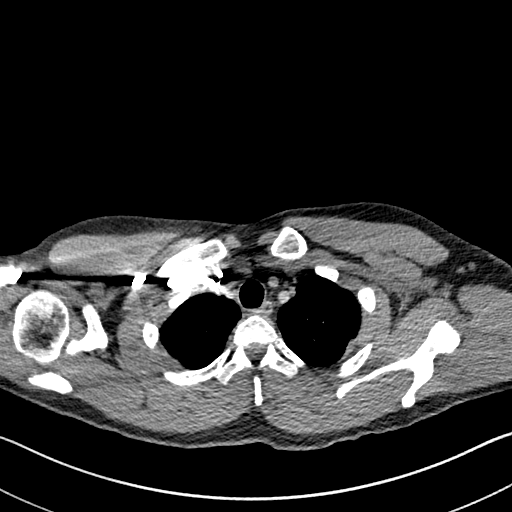
[im 241/252  lung]
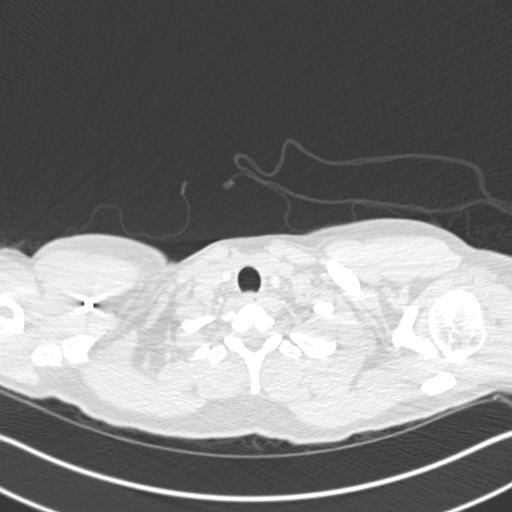

[Series 8: coronal mpr · coronal · 0.52mm/px · 3 of 129 slices shown]
[im 33/129  soft-tissue]
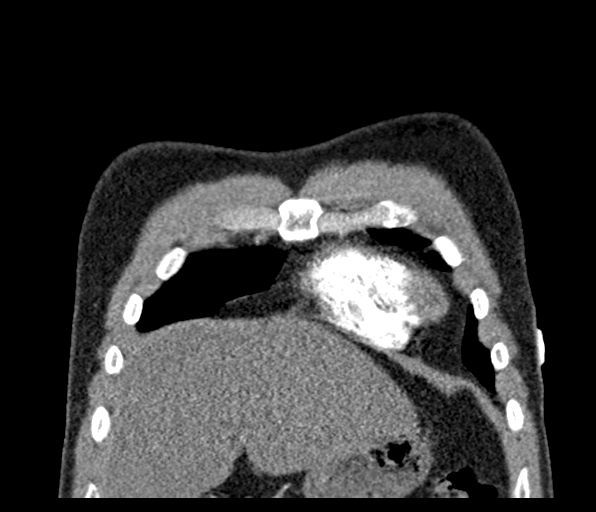
[im 65/129  soft-tissue]
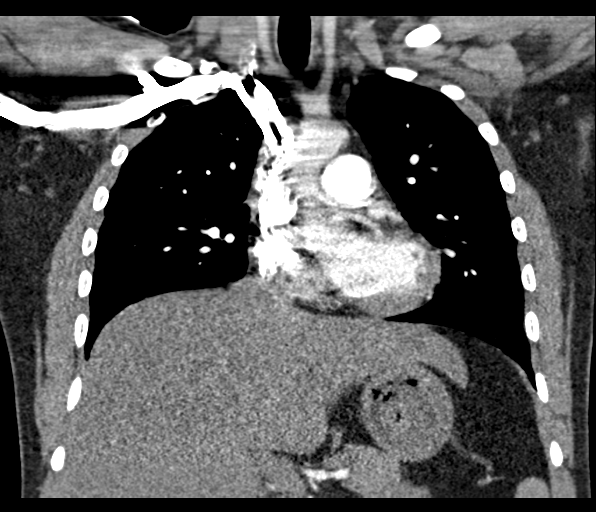
[im 97/129  soft-tissue]
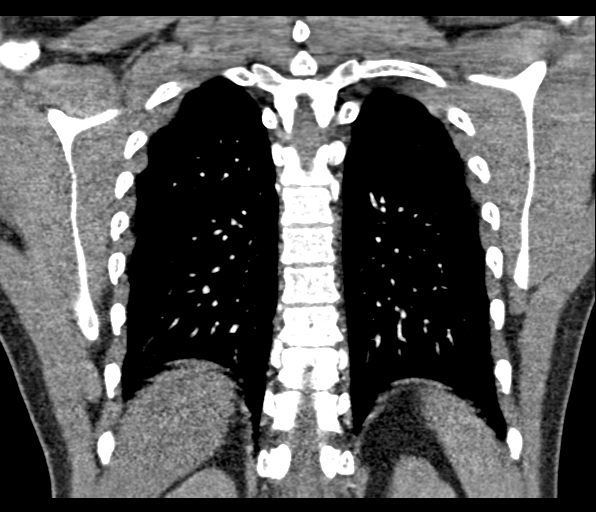

[18 of 46 positions shown; findings below may reference images not displayed]

FINDINGS: Cardiovascular: Thoracic aorta shows no evidence of aneurysmal
dilatation or dissection. No cardiac enlargement is seen. The
pulmonary artery shows a normal branching pattern. No filling defect
is identified to suggest pulmonary embolism.

Mediastinum/Nodes: Thoracic inlet is within normal limits. No hilar
or mediastinal adenopathy is noted. The esophagus as visualized is
within normal limits.

Lungs/Pleura: The lungs are well aerated bilaterally. Some minimal
ground-glass opacities are noted likely related to residual from the
prior history of NGX69-OW positivity. No focal infiltrate or sizable
effusion is seen. No parenchymal nodules are noted.

Upper Abdomen: Visualized upper abdomen shows no acute abnormality.
Fatty infiltration of the liver is noted.

Musculoskeletal: No chest wall abnormality. No acute or significant
osseous findings.

Review of the MIP images confirms the above findings.
IMPRESSION: No evidence of pulmonary emboli.

Minimal residual ground-glass opacities from prior NGX69-OW
positivity. No acute abnormality is noted.

## 2022-01-20 ENCOUNTER — Emergency Department (HOSPITAL_COMMUNITY)
Admission: EM | Admit: 2022-01-20 | Discharge: 2022-01-20 | Disposition: A | Discharge status: Home / Self Care | Payer: Self-pay | Attending: Emergency Medicine | Admitting: Emergency Medicine

## 2022-01-20 ENCOUNTER — Encounter (HOSPITAL_COMMUNITY): Payer: Self-pay | Admitting: Emergency Medicine

## 2022-01-20 DIAGNOSIS — R52 Pain, unspecified: Secondary | ICD-10-CM

## 2022-01-20 DIAGNOSIS — Z20822 Contact with and (suspected) exposure to covid-19: Secondary | ICD-10-CM | POA: Insufficient documentation

## 2022-01-20 DIAGNOSIS — R509 Fever, unspecified: Secondary | ICD-10-CM | POA: Insufficient documentation

## 2022-01-20 DIAGNOSIS — M791 Myalgia, unspecified site: Secondary | ICD-10-CM | POA: Insufficient documentation

## 2022-01-20 LAB — RESP PANEL BY RT-PCR (FLU A&B, COVID) ARPGX2
Influenza A by PCR: NEGATIVE
Influenza B by PCR: NEGATIVE
SARS Coronavirus 2 by RT PCR: NEGATIVE

## 2022-01-20 NOTE — Discharge Instructions (Signed)
As we discussed, your COVID and flu swab is pending at discharge.  You will need to review the results online on MyChart.  I recommend that he take over-the-counter cold medication and Tylenol/ibuprofen for fevers.  Follow-up with your primary care doctor as needed.  Return if development of any new or worsening symptoms.

## 2022-01-20 NOTE — ED Provider Notes (Signed)
[facility] EMERGENCY DEPARTMENT Provider Note   CSN: [id number] Arrival date & time: 01/20/22  1103     History  Chief Complaint  Patient presents with   Generalized Body Aches    Julian Carter is a 31 y.o. male.  Patient with noncontributory past medical history presents today with complaints of fever and bodyaches.  He states that same began over the weekend.  States that he last ran a fever on Sunday but has been taking regular Tylenol and ibuprofen.  States that he was around family members that are sick with similar symptoms and tested positive for the flu.  He presents today requesting a flu swab.  He denies any chest pain, shortness of breath, nausea, or vomiting.  The history is provided by the patient. No language interpreter was used.       Home Medications Prior to Admission medications   Medication Sig Start Date End Date Taking? Authorizing Provider  acetaminophen (TYLENOL) 500 MG tablet Take 1,000 mg by mouth every 6 (six) hours as needed for headache.    [provider]  albuterol (VENTOLIN HFA) 108 (90 Base) MCG/ACT inhaler Inhale 1-2 puffs into the lungs every 6 (six) hours as needed for wheezing or shortness of breath. 06/02/19   Henderly, Britni A, PA-C  benzonatate (TESSALON) 100 MG capsule Take 1 capsule (100 mg total) by mouth every 8 (eight) hours. 06/02/19   Henderly, Britni A, PA-C  budesonide-formoterol (SYMBICORT) 160-4.5 MCG/ACT inhaler Inhale 2 puffs into the lungs 2 (two) times daily. 07/04/19   Mannam, Hart Robinsons, MD  guaiFENesin-dextromethorphan (ROBITUSSIN DM) 100-10 MG/5ML syrup Take 5 mLs by mouth 3 (three) times daily as needed for cough. 06/14/19   Davonna Belling, MD      Allergies    Patient has no known allergies.    Review of Systems   Review of Systems  All other systems reviewed and are negative.   Physical Exam Updated Vital Signs BP (!) 148/91 (BP Location: Right Arm)   Pulse 96   Temp 98.8 F (37.1 C)  (Oral)   Resp 16   Ht 5' (1.524 m)   Wt 60.8 kg   SpO2 97%   BMI 26.17 kg/m  Physical Exam Vitals and nursing note reviewed.  Constitutional:      General: He is not in acute distress.    Appearance: Normal appearance. He is normal weight. He is not ill-appearing, toxic-appearing or diaphoretic.  HENT:     Head: Normocephalic and atraumatic.  Cardiovascular:     Rate and Rhythm: Normal rate.  Pulmonary:     Effort: Pulmonary effort is normal. No respiratory distress.  Musculoskeletal:        General: Normal range of motion.     Cervical back: Normal range of motion.  Skin:    General: Skin is warm and dry.  Neurological:     General: No focal deficit present.     Mental Status: He is alert.  Psychiatric:        Mood and Affect: Mood normal.        Behavior: Behavior normal.     ED Results / Procedures / Treatments   Labs (all labs ordered are listed, but only abnormal results are displayed) Labs Reviewed  RESP PANEL BY RT-PCR (FLU A&B, COVID) ARPGX2    EKG None  Radiology No results found.  Procedures Procedures    Medications Ordered in ED Medications - No data to display  ED Course/ Medical Decision  Making/ A&P                           Medical Decision Making  Patient presents today with complaints of fever and bodyaches x 3 days.  He is currently afebrile, nontoxic-appearing, and in no acute distress with reassuring vital signs.  He endorses known exposure to the flu and request a flu swab.  Swab for COVID and flu today, these results are pending at discharge.  Discussed that given he has been symptomatic for more than 48 hours, Tamiflu is not indicated.  Patient educated on how to review the results of the swab on his MyChart.  No further emergent concerns.  Patient is stable for discharge.  Educated on red flag symptoms that would prompt immediate return.  Patient understanding and amenable with plan, discharged in stable condition.   Final Clinical  Impression(s) / ED Diagnoses Final diagnoses:  Body aches    Rx / DC Orders ED Discharge Orders     None     An After Visit Summary was printed and given to the patient.     Vear Clock 01/20/22 1134    Melene Plan, DO 01/20/22 1606

## 2022-01-20 NOTE — ED Triage Notes (Signed)
Pt reports having a family gathering this weekend. Fever on Sunday night, body aches. Reports some of the family members have tested positive for flu.

## 2022-06-17 ENCOUNTER — Ambulatory Visit: Payer: Self-pay | Admitting: Family Medicine

## 2022-06-19 ENCOUNTER — Encounter: Payer: Self-pay | Admitting: General Practice

## 2023-01-01 ENCOUNTER — Encounter: Payer: Self-pay | Admitting: Urgent Care

## 2023-01-01 ENCOUNTER — Ambulatory Visit (INDEPENDENT_AMBULATORY_CARE_PROVIDER_SITE_OTHER): Payer: Self-pay | Admitting: Urgent Care

## 2023-01-01 VITALS — BP 126/89 | HR 71 | Temp 98.4°F | Ht 60.0 in | Wt 132.1 lb

## 2023-01-01 DIAGNOSIS — Z131 Encounter for screening for diabetes mellitus: Secondary | ICD-10-CM

## 2023-01-01 DIAGNOSIS — Z0000 Encounter for general adult medical examination without abnormal findings: Secondary | ICD-10-CM

## 2023-01-01 DIAGNOSIS — Z87442 Personal history of urinary calculi: Secondary | ICD-10-CM

## 2023-01-01 DIAGNOSIS — R0602 Shortness of breath: Secondary | ICD-10-CM

## 2023-01-01 DIAGNOSIS — Z13220 Encounter for screening for lipoid disorders: Secondary | ICD-10-CM

## 2023-01-01 DIAGNOSIS — M79609 Pain in unspecified limb: Secondary | ICD-10-CM

## 2023-01-01 DIAGNOSIS — R202 Paresthesia of skin: Secondary | ICD-10-CM

## 2023-01-01 DIAGNOSIS — Z1329 Encounter for screening for other suspected endocrine disorder: Secondary | ICD-10-CM

## 2023-01-01 DIAGNOSIS — R0609 Other forms of dyspnea: Secondary | ICD-10-CM

## 2023-01-01 DIAGNOSIS — R9431 Abnormal electrocardiogram [ECG] [EKG]: Secondary | ICD-10-CM

## 2023-01-01 DIAGNOSIS — K760 Fatty (change of) liver, not elsewhere classified: Secondary | ICD-10-CM

## 2023-01-01 DIAGNOSIS — M542 Cervicalgia: Secondary | ICD-10-CM

## 2023-01-01 LAB — LIPID PANEL
Cholesterol: 178 mg/dL (ref 0–200)
HDL: 45 mg/dL (ref >39.00)
LDL Cholesterol: 107 mg/dL — ABNORMAL HIGH (ref 0–99)
NonHDL: 133.46
Total CHOL/HDL Ratio: 4
Triglycerides: 133 mg/dL (ref 0.0–149.0)
VLDL: 26.6 mg/dL (ref 0.0–40.0)

## 2023-01-01 LAB — B12 AND FOLATE PANEL
Folate: 15.8 ng/mL (ref >5.9)
Vitamin B-12: 801 pg/mL (ref 211–911)

## 2023-01-01 LAB — COMPREHENSIVE METABOLIC PANEL
ALT: 60 U/L — ABNORMAL HIGH (ref 0–53)
AST: 32 U/L (ref 0–37)
Albumin: 4.7 g/dL (ref 3.5–5.2)
Alkaline Phosphatase: 92 U/L (ref 39–117)
BUN: 12 mg/dL (ref 6–23)
CO2: 29 meq/L (ref 19–32)
Calcium: 9.9 mg/dL (ref 8.4–10.5)
Chloride: 103 meq/L (ref 96–112)
Creatinine, Ser: 0.84 mg/dL (ref 0.40–1.50)
GFR: 116.96 mL/min (ref >60.00)
Glucose, Bld: 92 mg/dL (ref 70–99)
Potassium: 4.4 meq/L (ref 3.5–5.1)
Sodium: 140 meq/L (ref 135–145)
Total Bilirubin: 0.6 mg/dL (ref 0.2–1.2)
Total Protein: 7.8 g/dL (ref 6.0–8.3)

## 2023-01-01 LAB — CBC
HCT: 47.8 % (ref 39.0–52.0)
Hemoglobin: 15.6 g/dL (ref 13.0–17.0)
MCHC: 32.7 g/dL (ref 30.0–36.0)
MCV: 89.2 fL (ref 78.0–100.0)
Platelets: 331 10*3/uL (ref 150.0–400.0)
RBC: 5.35 Mil/uL (ref 4.22–5.81)
RDW: 12.7 % (ref 11.5–15.5)
WBC: 7.7 10*3/uL (ref 4.0–10.5)

## 2023-01-01 LAB — HEMOGLOBIN A1C: Hgb A1c MFr Bld: 5.6 % (ref 4.6–6.5)

## 2023-01-01 LAB — TSH: TSH: 2.2 u[IU]/mL (ref 0.35–5.50)

## 2023-01-01 MED ORDER — BUDESONIDE-FORMOTEROL FUMARATE 160-4.5 MCG/ACT IN AERO: 2.0000 | INHALATION_SPRAY | Freq: Two times a day (BID) | RESPIRATORY_TRACT | 6 refills | Status: AC

## 2023-01-01 MED ORDER — BACLOFEN 10 MG PO TABS: 10.0000 mg | ORAL_TABLET | Freq: Three times a day (TID) | ORAL | 0 refills | Status: AC | PRN

## 2023-01-01 MED ORDER — ALBUTEROL SULFATE HFA 108 (90 BASE) MCG/ACT IN AERS: 1.0000 | INHALATION_SPRAY | Freq: Four times a day (QID) | RESPIRATORY_TRACT | 3 refills | Status: AC | PRN

## 2023-01-01 NOTE — Assessment & Plan Note (Signed)
Post-COVID Respiratory Symptoms Shortness of breath, particularly during physical activity. Previous use of Symbicort was beneficial. -Refill Symbicort prescription. -Will also refill PRN albuterol inhaler  Cardiovascular Health History of arrhythmia and post-COVID symptoms. No current symptoms of cardiovascular disease in office. -Perform EKG to assess for potential post-COVID cardiomyopathy or other cardiovascular disease.

## 2023-01-01 NOTE — Patient Instructions (Addendum)
We have drawn labs today to assess your general health and liver function. We have also drawn tests to assess cause of your paresthesias.   You have cervical trigger point, which is knots in the muscles of the neck. This may actually be the cause of your paresthesias on the right. - Please apply a warm moist compress, such as a microwavable heating pack, to your neck several times daily. - After each warm compress, apply the technique that we discussed today of ischemic release. This is a prolonged, deep pressure into the knot of the muscle to release the tension. - Take the muscle relaxer three times daily as needed. Keep in mind it may make you feel tired or drowsy, so do not operate machinery or drive a car until you know how it affects you. - Try to stay hydrated with WATER as dehydration and caffeine intake can worsen this condition.   I have refilled your albuterol inhaler and your Symbicort. Take the symbicort 2 puffs daily. Rinse your mouth out with water after each use to prevent thrush. Use the albuterol as needed, preferably 2 puffs 15 minutes prior to exercising or playing soccer.   If your shortness of breath does not improve with the inhalers prescribed today, I would recommend a further workup with an ECHO, which is an ultrasound of your heart.   Follow up in office based upon lab results and response to therapy.

## 2023-01-01 NOTE — Assessment & Plan Note (Signed)
Noted on CT scan of chest. No formal workup has been performed to date. Pt without any GI symptoms. -Check LFTs today

## 2023-01-01 NOTE — Assessment & Plan Note (Signed)
Unilateral Paresthesia Right-sided numbness, tingling, and weakness in the face, arm, and leg. No clear trigger identified. 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 -Apply microwavable heat pack to the back of the neck for 10 minutes 3x daily. -Perform sustained deep pressure massage on the neck. -Prescribe Baclofen, a muscle relaxant for potential tension relief. Use up to 3x/day; side effects discussed. -B12, folate and thiamine also obtained

## 2023-01-01 NOTE — Progress Notes (Signed)
New Patient Office Visit  Subjective:  Patient ID: Julian Carter, male    DOB: May 04, 1990  Age: 32 y.o. MRN: [id number]  CC:  Chief Complaint  Patient presents with   Annual Exam    New pt fasting CPE. 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 The symptoms, described as a "weird sensation," occur randomly and not all at once, affecting the right leg, arm, hand, and face. The patient reported a feeling of weakness in the right leg, particularly when sitting or standing, leading to occasional stumbling. The right arm and hand sometimes feel numb, with the hand feeling heavy and difficult to move. The patient also reported a sensation of numbness and tingling in the right side of the face, described as an after-sensation of being hit with a ball.  The patient sought medical attention for these symptoms at a local hospital approximately two months ago, but could not recall the specifics of the workup or diagnosis. The patient has a history of arrhythmia and was previously on medication for this condition.  The patient also reported a significant decrease in physical stamina since recovering from COVID-19 infection in 2021, experiencing shortness of breath after minimal exertion such as playing soccer or playing with his three young children. The patient reported a history of frequent ear infections and a burst eardrum in the right ear during childhood.  The patient denied any significant alcohol intake, smoking history, or use of over-the-counter supplements. The patient reported a previous diagnosis of vitamin and iron deficiencies but could not recall the specifics. The patient also reported occasional headaches managed with over-the-counter Tylenol.  The patient has a history of  fatty liver disease, identified on a CT scan >a year ago, and a small kidney stone on the left side. The patient was previously prescribed Symbicort, which was reported to be helpful, but the patient has since discontinued this medication. The patient denies chronic cough, but feels SOB with minimal exertion. He was referred to pulmonologist but never followed through with this referral.  The patient's symptoms have caused concern and prompted the patient to seek a comprehensive physical examination to identify any potential underlying issues. The patient expressed a desire to improve his health for the sake of his three young children.    Outpatient Encounter Medications as of 01/01/2023  Medication Sig   baclofen (LIORESAL) 10 MG tablet Take 1 tablet (10 mg total) by mouth 3 (three) times daily as needed for muscle spasms (neck pain).   acetaminophen (TYLENOL) 500 MG tablet Take 1,000 mg by mouth every 6 (six) hours as needed for headache. (Patient not taking: Reported on 01/01/2023)   albuterol (VENTOLIN HFA) 108 (90 Base) MCG/ACT inhaler Inhale 1-2 puffs into the lungs every 6 (six) hours as needed for wheezing or shortness of breath.   budesonide-formoterol (SYMBICORT) 160-4.5 MCG/ACT inhaler Inhale 2 puffs into the lungs 2 (two) times daily.   [DISCONTINUED] albuterol (VENTOLIN HFA) 108 (90 Base) MCG/ACT inhaler Inhale 1-2 puffs into the lungs every 6 (six) hours as needed for wheezing or shortness of breath. (Patient not taking: Reported on 01/01/2023)   [DISCONTINUED] benzonatate (TESSALON) 100 MG capsule Take 1 capsule (100 mg total) by mouth every 8 (eight) hours.   [DISCONTINUED] budesonide-formoterol (SYMBICORT) 160-4.5 MCG/ACT  inhaler Inhale 2 puffs into the lungs 2 (two) times daily. (Patient not taking: Reported on 01/01/2023)   [DISCONTINUED] guaiFENesin-dextromethorphan (ROBITUSSIN DM) 100-10 MG/5ML syrup Take 5 mLs by mouth 3 (three) times daily as needed for cough.   No  facility-administered encounter medications on file as of 01/01/2023.    Past Medical History:  Diagnosis Date   COVID-19     History reviewed. No pertinent surgical history.  History reviewed. No pertinent family history.  Social History   Socioeconomic History   Marital status: Married    Spouse name: Not on file   Number of children: Not on file   Years of education: Not on file   Highest education level: Not on file  Occupational History   Not on file  Tobacco Use   Smoking status: Never   Smokeless tobacco: Never  Substance and Sexual Activity   Alcohol use: Not Currently    Comment: occ.    Drug use: Not Currently   Sexual activity: Not on file  Other Topics Concern   Not on file  Social History Narrative   Not on file   Social Determinants of Health   Financial Resource Strain: Not on file  Food Insecurity: Not on file  Transportation Needs: Not on file  Physical Activity: Not on file  Stress: Not on file  Social Connections: Not on file  Intimate Partner Violence: Not on file    ROS: as noted in HPI  Objective:  BP 126/89   Pulse 71   Temp 98.4 F (36.9 C) (Oral)   Ht 5' (1.524 m)   Wt 132 lb 1.9 oz (59.9 kg)   SpO2 97%   BMI 25.80 kg/m   Physical Exam Vitals and nursing note reviewed.  Constitutional:      General: He is not in acute distress.    Appearance: Normal appearance. He is not ill-appearing, toxic-appearing or diaphoretic.  HENT:     Head: Normocephalic and atraumatic.     Right Ear: Ear canal and external ear normal. No middle ear effusion. There is no impacted cerumen. Tympanic membrane is scarred. Tympanic membrane is not perforated or erythematous.     Left Ear: Tympanic membrane, ear canal and external ear normal.  No middle ear effusion. There is no impacted cerumen. Tympanic membrane is not perforated or erythematous.     Nose: Nose normal.     Mouth/Throat:     Mouth: Mucous membranes are moist.     Pharynx: Oropharynx  is clear. No oropharyngeal exudate or posterior oropharyngeal erythema.  Eyes:     General: No visual field deficit or scleral icterus.       Right eye: No discharge.        Left eye: No discharge.     Extraocular Movements: Extraocular movements intact.     Pupils: Pupils are equal, round, and reactive to light.  Neck:     Thyroid: No thyroid mass, thyromegaly or thyroid tenderness.   Cardiovascular:     Rate and Rhythm: Normal rate and regular rhythm.     Pulses: Normal pulses.     Heart sounds: No murmur heard. Pulmonary:     Effort: Pulmonary effort is normal. No respiratory distress.     Breath sounds: Normal breath sounds. No stridor. No wheezing or rhonchi.  Abdominal:     General: Abdomen is flat. Bowel sounds are normal. There is no distension.     Palpations: Abdomen is soft. There is no mass.  Tenderness: There is no abdominal tenderness. There is no guarding.  Musculoskeletal:     Cervical back: Normal range of motion and neck supple. Tenderness present. No rigidity.     Right lower leg: No edema.     Left lower leg: No edema.  Lymphadenopathy:     Cervical: No cervical adenopathy.  Skin:    General: Skin is warm and dry.     Coloration: Skin is not jaundiced.     Findings: No bruising, erythema or rash.  Neurological:     General: No focal deficit present.     Mental Status: He is alert and oriented to person, place, and time. Mental status is at baseline.     Cranial Nerves: Cranial nerves 2-12 are intact. No cranial nerve deficit, dysarthria or facial asymmetry.     Sensory: Sensation is intact. No sensory deficit.     Motor: Motor function is intact. No weakness, tremor, atrophy, abnormal muscle tone or pronator drift.     Coordination: Coordination is intact. Finger-Nose-Finger Test normal. Rapid alternating movements normal.     Gait: Gait is intact. Gait normal.     Deep Tendon Reflexes: Reflexes are normal and symmetric.     Comments: Pt had a "lighter  sensation" noted on R, but still able to identify soft touch compared to L  Psychiatric:        Mood and Affect: Mood normal.        Behavior: Behavior normal.     Last CBC Lab Results  Component Value Date   WBC 14.2 (H) 06/14/2019   HGB 14.8 06/14/2019   HCT 42.5 06/14/2019   MCV 87.4 06/14/2019   MCH 30.5 06/14/2019   RDW 12.9 06/14/2019   PLT 246 06/14/2019   Last metabolic panel Lab Results  Component Value Date   GLUCOSE 110 (H) 06/14/2019   NA 131 (L) 06/14/2019   K 3.3 (L) 06/14/2019   CL 98 06/14/2019   CO2 22 06/14/2019   BUN 8 06/14/2019   CREATININE 0.83 06/14/2019   GFRNONAA >60 06/14/2019   CALCIUM 8.7 (L) 06/14/2019   PROT 7.2 06/14/2019   ALBUMIN 3.9 06/14/2019   BILITOT 1.0 06/14/2019   ALKPHOS 94 06/14/2019   AST 43 (H) 06/14/2019   ALT 85 (H) 06/14/2019   ANIONGAP 11 06/14/2019     Orders placed or performed in visit on 01/01/23   EKG 12-Lead   EKG Interpretation Date/Time:  01/01/2023  Ventricular Rate:   72 bpm PR Interval:   146 QRS Duration:    QT Interval:   376 QTC Calculation:   R Axis:      Text Interpretation:  Sinus rhythm with horizontal axis   Assessment & Plan:  General medical examination -     CBC -     Comprehensive metabolic panel -     TSH -     Lipid panel -     Hemoglobin A1c  Fatty liver Assessment & Plan: Noted on CT scan of chest. No formal workup has been performed to date. Pt without any GI symptoms. -Check LFTs today  Orders: -     CBC -     Comprehensive metabolic panel  History of nephrolithiasis  Encounter for screening for lipid disorder -     Lipid panel  Thyroid disorder screen -     TSH  Screening for diabetes mellitus -     Comprehensive metabolic panel -     Hemoglobin A1c  Paresthesia and  pain of right extremity Assessment & Plan: Unilateral Paresthesia Right-sided numbness, tingling, and weakness in the face, arm, and leg. No clear trigger identified. Possible compressive  neuropathy suggested. -Apply microwavable heat pack to the back of the neck for 10 minutes 3x daily. -Perform sustained deep pressure massage on the neck. -Prescribe Baclofen, a muscle relaxant for potential tension relief. Use up to 3x/day; side effects discussed. -B12, folate and thiamine also obtained  Orders: -     B12 and Folate Panel -     Vitamin B1 -     Baclofen; Take 1 tablet (10 mg total) by mouth 3 (three) times daily as needed for muscle spasms (neck pain).  Dispense: 30 each; Refill: 0  Dyspnea on exertion Assessment & Plan: Post-COVID Respiratory Symptoms Shortness of breath, particularly during physical activity. Previous use of Symbicort was beneficial. -Refill Symbicort prescription. -Will also refill PRN albuterol inhaler  Cardiovascular Health History of arrhythmia and post-COVID symptoms. No current symptoms of cardiovascular disease in office. -Perform EKG to assess for potential post-COVID cardiomyopathy or other cardiovascular disease.  Orders: -     Albuterol Sulfate HFA; Inhale 1-2 puffs into the lungs every 6 (six) hours as needed for wheezing or shortness of breath.  Dispense: 8 g; Refill: 3 -     Budesonide-Formoterol Fumarate; Inhale 2 puffs into the lungs 2 (two) times daily.  Dispense: 6 g; Refill: 6 -     EKG 12-Lead -     ECHOCARDIOGRAM COMPLETE; Future  Shortness of breath -     Albuterol Sulfate HFA; Inhale 1-2 puffs into the lungs every 6 (six) hours as needed for wheezing or shortness of breath.  Dispense: 8 g; Refill: 3 -     Budesonide-Formoterol Fumarate; Inhale 2 puffs into the lungs 2 (two) times daily.  Dispense: 6 g; Refill: 6 -     EKG 12-Lead -     ECHOCARDIOGRAM COMPLETE; Future  Myofascial neck pain -     Baclofen; Take 1 tablet (10 mg total) by mouth 3 (three) times daily as needed for muscle spasms (neck pain).  Dispense: 30 each; Refill: 0  Left axis deviation -     EKG 12-Lead    No follow-ups on file.   Maretta Bees,  PA

## 2023-01-06 LAB — VITAMIN B1: Vitamin B1 (Thiamine): 8 nmol/L (ref 8–30)

## 2023-01-21 ENCOUNTER — Encounter (HOSPITAL_COMMUNITY): Payer: Self-pay

## 2023-01-21 ENCOUNTER — Emergency Department (HOSPITAL_COMMUNITY)
Admission: EM | Admit: 2023-01-21 | Discharge: 2023-01-21 | Disposition: A | Discharge status: Home / Self Care | Payer: Self-pay | Attending: Student | Admitting: Student

## 2023-01-21 ENCOUNTER — Other Ambulatory Visit: Payer: Self-pay

## 2023-01-21 ENCOUNTER — Emergency Department (HOSPITAL_COMMUNITY): Payer: Self-pay

## 2023-01-21 DIAGNOSIS — F419 Anxiety disorder, unspecified: Secondary | ICD-10-CM | POA: Insufficient documentation

## 2023-01-21 DIAGNOSIS — R051 Acute cough: Secondary | ICD-10-CM | POA: Insufficient documentation

## 2023-01-21 LAB — BASIC METABOLIC PANEL
Anion gap: 8 (ref 5–15)
BUN: 15 mg/dL (ref 6–20)
CO2: 21 mmol/L — ABNORMAL LOW (ref 22–32)
Calcium: 8.9 mg/dL (ref 8.9–10.3)
Chloride: 108 mmol/L (ref 98–111)
Creatinine, Ser: 0.71 mg/dL (ref 0.61–1.24)
GFR, Estimated: >60 mL/min (ref >60)
Glucose, Bld: 100 mg/dL — ABNORMAL HIGH (ref 70–99)
Potassium: 3.5 mmol/L (ref 3.5–5.1)
Sodium: 137 mmol/L (ref 135–145)

## 2023-01-21 LAB — TROPONIN I (HIGH SENSITIVITY): Troponin I (High Sensitivity): 7 ng/L (ref <18)

## 2023-01-21 LAB — CBC
HCT: 42.3 % (ref 39.0–52.0)
Hemoglobin: 15.2 g/dL (ref 13.0–17.0)
MCH: 30.6 pg (ref 26.0–34.0)
MCHC: 35.9 g/dL (ref 30.0–36.0)
MCV: 85.3 fL (ref 80.0–100.0)
Platelets: 300 10*3/uL (ref 150–400)
RBC: 4.96 MIL/uL (ref 4.22–5.81)
RDW: 12.2 % (ref 11.5–15.5)
WBC: 7.3 10*3/uL (ref 4.0–10.5)
nRBC: 0 % (ref 0.0–0.2)

## 2023-01-21 NOTE — ED Provider Notes (Signed)
[place] EMERGENCY DEPARTMENT AT [facility] Provider Note   CSN: [id number] Arrival date & time: 01/21/23  8469     History  Chief Complaint  Patient presents with   Cough   Chest Pain    Julian Carter is a 32 y.o. male past medical history significant for anxiety presents today for intermittent episodes of mid chest pain.  Patient states that the pain lasted for a couple of seconds and then resolves without treatment.  Patient also endorses feeling like his heart is racing and like he cannot catch his breath during these episodes.  Patient has no cardiac history.  Patient also reports cough x 3 weeks.  No other URI symptoms.   Cough Associated symptoms: chest pain   Chest Pain Associated symptoms: cough        Home Medications Prior to Admission medications   Medication Sig Start Date End Date Taking? Authorizing Provider  acetaminophen (TYLENOL) 500 MG tablet Take 1,000 mg by mouth every 6 (six) hours as needed for headache. Patient not taking: Reported on 01/01/2023    [provider]  albuterol (VENTOLIN HFA) 108 (90 Base) MCG/ACT inhaler Inhale 1-2 puffs into the lungs every 6 (six) hours as needed for wheezing or shortness of breath. 01/01/23   Crain, Whitney L, PA  baclofen (LIORESAL) 10 MG tablet Take 1 tablet (10 mg total) by mouth 3 (three) times daily as needed for muscle spasms (neck pain). 01/01/23   Crain, Whitney L, PA  budesonide-formoterol (SYMBICORT) 160-4.5 MCG/ACT inhaler Inhale 2 puffs into the lungs 2 (two) times daily. 01/01/23   Guy Sandifer L, PA      Allergies    Patient has no known allergies.    Review of Systems   Review of Systems  Respiratory:  Positive for cough.   Cardiovascular:  Positive for chest pain.    Physical Exam Updated Vital Signs BP 118/85   Pulse 75   Temp 98.6 F (37 C) (Oral)   Resp 17   Ht 5' (1.524 m)   Wt 59.9 kg   SpO2 99%   BMI 25.80 kg/m  Physical Exam Vitals and nursing note  reviewed.  Constitutional:      General: He is not in acute distress.    Appearance: He is well-developed. He is not ill-appearing.  HENT:     Head: Normocephalic and atraumatic.  Eyes:     Extraocular Movements: Extraocular movements intact.     Conjunctiva/sclera: Conjunctivae normal.     Pupils: Pupils are equal, round, and reactive to light.  Cardiovascular:     Rate and Rhythm: Normal rate and regular rhythm.     Heart sounds: Normal heart sounds. No murmur heard. Pulmonary:     Effort: Pulmonary effort is normal. No respiratory distress.     Breath sounds: Normal breath sounds. No decreased breath sounds.  Chest:     Chest wall: No mass or tenderness.  Abdominal:     Palpations: Abdomen is soft.     Tenderness: There is no abdominal tenderness.  Musculoskeletal:        General: No swelling.     Cervical back: Normal range of motion and neck supple.  Skin:    General: Skin is warm and dry.     Capillary Refill: Capillary refill takes less than 2 seconds.  Neurological:     General: No focal deficit present.     Mental Status: He is alert.  Psychiatric:  Mood and Affect: Mood normal.        Behavior: Behavior normal.     ED Results / Procedures / Treatments   Labs (all labs ordered are listed, but only abnormal results are displayed) Labs Reviewed  BASIC METABOLIC PANEL - Abnormal; Notable for the following components:      Result Value   CO2 21 (*)    Glucose, Bld 100 (*)    All other components within normal limits  CBC  TROPONIN I (HIGH SENSITIVITY)  TROPONIN I (HIGH SENSITIVITY)    EKG None  Radiology DG Chest 2 View  Result Date: 01/21/2023 CLINICAL DATA:  Chest pain. EXAM: CHEST - 2 VIEW COMPARISON:  06/14/2019. FINDINGS: Bilateral lung fields are clear. Bilateral costophrenic angles are clear. Normal cardio-mediastinal silhouette. No acute osseous abnormalities. The soft tissues are within normal limits. IMPRESSION: *No active cardiopulmonary  disease. Electronically Signed   By: Jules Schick M.D.   On: 01/21/2023 10:53    Procedures Procedures    Medications Ordered in ED Medications - No data to display  ED Course/ Medical Decision Making/ A&P                                 Medical Decision Making Amount and/or Complexity of Data Reviewed Labs: ordered. Radiology: ordered.   This patient presents to the ED with chief complaint(s) of intermittent chest pain with pertinent past medical history of anxiety which further complicates the presenting complaint. The complaint involves an extensive differential diagnosis and also carries with it a high risk of complications and morbidity.    The differential diagnosis includes anxiety, STEMI, NSTEMI  Additional history obtained: Records reviewed Care Everywhere/External Records  ED Course and Reassessment:   Independent labs interpretation:  The following labs were independently interpreted:  BMP: Mildly decreased CO2 CBC: No notable findings Troponin: 7 EKG: Sinus rhythm  Independent visualization of imaging: - I independently visualized the following imaging with scope of interpretation limited to determining acute life threatening conditions related to emergency care: Last x-ray, which revealed no active cardiopulmonary disease.  Consultation: - Consulted or discussed management/test interpretation w/ external professional: None  Consideration for admission or further workup: Considered for admission or further workup however patient's vital signs, physical exam, labs, and imaging have all been reassuring.  Patient has a history of anxiety and feels that a lot of his symptoms can be attributed to that.  Patient is agreeable to discharge and having follow-up for further evaluation and treatment by his PCP.         Final Clinical Impression(s) / ED Diagnoses Final diagnoses:  Acute cough  Anxiety    Rx / DC Orders ED Discharge Orders     None          Dolphus Jenny, PA-C 01/21/23 1444    Kommor, [place], MD 01/21/23 2310

## 2023-01-21 NOTE — Discharge Instructions (Signed)
Today you were seen for chest pain due to anxiety.  Please schedule an appointment with your PCP for further evaluation and possible treatment.  Thank you for letting us treat you today. After reviewing your labs and imaging, I feel you are safe to go home. Please follow up with your PCP in the next several days and provide them with your records from this visit. Return to the Emergency Room if pain becomes severe or symptoms worsen.

## 2023-01-21 NOTE — ED Triage Notes (Addendum)
Patient reports cough x 3 weeks. Tested negative for Covid, flu, and RSV. Today reports mid chest pain. Denies cardiac hx.

## 2023-02-01 ENCOUNTER — Ambulatory Visit (HOSPITAL_BASED_OUTPATIENT_CLINIC_OR_DEPARTMENT_OTHER): Payer: Self-pay
